# Patient Record
Sex: Male | Born: 1935 | Race: White | Hispanic: No | Marital: Married | Smoking: Former smoker
Health system: Southern US, Community
[De-identification: ages and names within clinical notes are randomized; demographics above are authoritative.]

## PROBLEM LIST (undated history)

## (undated) DIAGNOSIS — H409 Unspecified glaucoma: Secondary | ICD-10-CM

## (undated) DIAGNOSIS — J449 Chronic obstructive pulmonary disease, unspecified: Secondary | ICD-10-CM

## (undated) DIAGNOSIS — J45909 Unspecified asthma, uncomplicated: Secondary | ICD-10-CM

## (undated) DIAGNOSIS — C799 Secondary malignant neoplasm of unspecified site: Secondary | ICD-10-CM

## (undated) DIAGNOSIS — I10 Essential (primary) hypertension: Secondary | ICD-10-CM

## (undated) DIAGNOSIS — M1990 Unspecified osteoarthritis, unspecified site: Secondary | ICD-10-CM

## (undated) DIAGNOSIS — R109 Unspecified abdominal pain: Secondary | ICD-10-CM

## (undated) DIAGNOSIS — Z87891 Personal history of nicotine dependence: Secondary | ICD-10-CM

## (undated) DIAGNOSIS — I509 Heart failure, unspecified: Secondary | ICD-10-CM

## (undated) DIAGNOSIS — I700 Atherosclerosis of aorta: Secondary | ICD-10-CM

## (undated) DIAGNOSIS — S22079A Unspecified fracture of T9-T10 vertebra, initial encounter for closed fracture: Secondary | ICD-10-CM

## (undated) DIAGNOSIS — I2510 Atherosclerotic heart disease of native coronary artery without angina pectoris: Secondary | ICD-10-CM

## (undated) DIAGNOSIS — Z8709 Personal history of other diseases of the respiratory system: Secondary | ICD-10-CM

## (undated) DIAGNOSIS — J302 Other seasonal allergic rhinitis: Secondary | ICD-10-CM

## (undated) DIAGNOSIS — N200 Calculus of kidney: Secondary | ICD-10-CM

## (undated) HISTORY — DX: Personal history of nicotine dependence: Z87.891

## (undated) HISTORY — DX: Chronic obstructive pulmonary disease, unspecified: J44.9

## (undated) HISTORY — DX: Unspecified abdominal pain: R10.9

## (undated) HISTORY — DX: Unspecified osteoarthritis, unspecified site: M19.90

## (undated) HISTORY — DX: Unspecified fracture of t9-t10 vertebra, initial encounter for closed fracture: S22.079A

## (undated) HISTORY — DX: Unspecified glaucoma: H40.9

## (undated) HISTORY — DX: Calculus of kidney: N20.0

## (undated) HISTORY — DX: Atherosclerotic heart disease of native coronary artery without angina pectoris: I25.10

## (undated) HISTORY — DX: Other seasonal allergic rhinitis: J30.2

## (undated) HISTORY — DX: Personal history of other diseases of the respiratory system: Z87.09

## (undated) HISTORY — DX: Atherosclerosis of aorta: I70.0

## (undated) HISTORY — DX: Secondary malignant neoplasm of unspecified site: C79.9

---

## 2006-08-07 DIAGNOSIS — I2510 Atherosclerotic heart disease of native coronary artery without angina pectoris: Secondary | ICD-10-CM

## 2006-08-07 HISTORY — DX: Atherosclerotic heart disease of native coronary artery without angina pectoris: I25.10

## 2006-08-07 HISTORY — PX: CORONARY ARTERY BYPASS GRAFT: SHX141

## 2009-01-05 HISTORY — PX: CAROTID ENDARTERECTOMY: SUR193

## 2015-07-21 LAB — COMPREHENSIVE METABOLIC PANEL
ALT: 18
AST: 18 U/L
Alkaline Phosphatase: 78 U/L
BILIRUBIN TOTAL: 0.8 mg/dL
CREATININE: 0.97
GLUCOSE: 105
POTASSIUM: 4.3 mmol/L

## 2015-07-21 LAB — HEMOGLOBIN A1C: A1C: 5.5

## 2015-07-21 LAB — LIPID PANEL
Cholesterol: 203
HDL: 38
LDL CALC: 148
Triglycerides: 83

## 2015-07-21 LAB — CBC
HGB: 15.5 g/dL
PLATELET COUNT: 161
WBC: 8.3

## 2015-07-21 LAB — PSA: PSA: 1.8

## 2015-07-21 LAB — TSH: TSH: 5.9

## 2015-10-06 ENCOUNTER — Encounter (INDEPENDENT_AMBULATORY_CARE_PROVIDER_SITE_OTHER): Payer: Self-pay

## 2015-10-06 ENCOUNTER — Ambulatory Visit (INDEPENDENT_AMBULATORY_CARE_PROVIDER_SITE_OTHER): Payer: Medicare PPO | Admitting: Family Medicine

## 2015-10-06 ENCOUNTER — Encounter: Payer: Self-pay | Admitting: Family Medicine

## 2015-10-06 VITALS — BP 116/72 | HR 68 | Temp 98.3°F | Ht 65.0 in | Wt 166.8 lb

## 2015-10-06 DIAGNOSIS — J449 Chronic obstructive pulmonary disease, unspecified: Secondary | ICD-10-CM | POA: Diagnosis not present

## 2015-10-06 DIAGNOSIS — R634 Abnormal weight loss: Secondary | ICD-10-CM

## 2015-10-06 DIAGNOSIS — N200 Calculus of kidney: Secondary | ICD-10-CM

## 2015-10-06 DIAGNOSIS — R1909 Other intra-abdominal and pelvic swelling, mass and lump: Secondary | ICD-10-CM

## 2015-10-06 DIAGNOSIS — I2510 Atherosclerotic heart disease of native coronary artery without angina pectoris: Secondary | ICD-10-CM | POA: Insufficient documentation

## 2015-10-06 DIAGNOSIS — H409 Unspecified glaucoma: Secondary | ICD-10-CM | POA: Insufficient documentation

## 2015-10-06 DIAGNOSIS — R1033 Periumbilical pain: Secondary | ICD-10-CM | POA: Diagnosis not present

## 2015-10-06 DIAGNOSIS — R140 Abdominal distension (gaseous): Secondary | ICD-10-CM

## 2015-10-06 HISTORY — PX: COLONOSCOPY: SHX174

## 2015-10-06 LAB — COMPREHENSIVE METABOLIC PANEL WITH GFR
ALT: 26 U/L (ref 0–53)
AST: 34 U/L (ref 0–37)
Albumin: 3.7 g/dL (ref 3.5–5.2)
Alkaline Phosphatase: 77 U/L (ref 39–117)
BUN: 19 mg/dL (ref 6–23)
CO2: 32 meq/L (ref 19–32)
Calcium: 9.1 mg/dL (ref 8.4–10.5)
Chloride: 102 meq/L (ref 96–112)
Creatinine, Ser: 1 mg/dL (ref 0.40–1.50)
GFR: 76.69 mL/min
Glucose, Bld: 94 mg/dL (ref 70–99)
Potassium: 4 meq/L (ref 3.5–5.1)
Sodium: 139 meq/L (ref 135–145)
Total Bilirubin: 1.1 mg/dL (ref 0.2–1.2)
Total Protein: 6.7 g/dL (ref 6.0–8.3)

## 2015-10-06 LAB — CBC WITH DIFFERENTIAL/PLATELET
BASOS PCT: 0.3 % (ref 0.0–3.0)
Basophils Absolute: 0 10*3/uL (ref 0.0–0.1)
EOS ABS: 0.2 10*3/uL (ref 0.0–0.7)
Eosinophils Relative: 2.1 % (ref 0.0–5.0)
HEMATOCRIT: 40.8 % (ref 39.0–52.0)
Hemoglobin: 13.9 g/dL (ref 13.0–17.0)
Lymphocytes Relative: 23.9 % (ref 12.0–46.0)
Lymphs Abs: 1.7 10*3/uL (ref 0.7–4.0)
MCHC: 34 g/dL (ref 30.0–36.0)
MCV: 99.5 fl (ref 78.0–100.0)
MONO ABS: 0.7 10*3/uL (ref 0.1–1.0)
Monocytes Relative: 9.1 % (ref 3.0–12.0)
Neutro Abs: 4.7 10*3/uL (ref 1.4–7.7)
Neutrophils Relative %: 64.6 % (ref 43.0–77.0)
Platelets: 208 10*3/uL (ref 150.0–400.0)
RBC: 4.1 Mil/uL — ABNORMAL LOW (ref 4.22–5.81)
RDW: 14.5 % (ref 11.5–15.5)
WBC: 7.2 10*3/uL (ref 4.0–10.5)

## 2015-10-06 NOTE — Progress Notes (Addendum)
BP 116/72 mmHg  Pulse 68  Temp(Src) 98.3 F (36.8 C) (Oral)  Ht 5\' 5"  (1.651 m)  Wt 166 lb 12 oz (75.637 kg)  BMI 27.75 kg/m2   CC: new pt to establish care  Subjective:    Patient ID: Randy Wright, male    DOB: [birth year], 81 y.o.   MRN: [id number]  HPI: Randy Wright is a 81 y.o. male presenting on 10/06/2015 for Establish Care   Husband of Adyant Crowson. Prior saw Dr Ronne Binning in [place] requested today.  Over last 1-2 months noticing some lower abdominal discomfort, distension and umbilical swelling. Also noticed new bulge in groin 3 wks ago. Normal bowel movements, passing gas fine. No nausea/vomiting. No GERD sxs, dysphagia or early satiety. Last BM was this morning x2. Endorses 30lb weight loss over last 3 months - he was trying to lose weight by changing diet.   Asthma/COPD - on symbicort 2 puffs bid, uses this PRN. Doesn't have rescue inhaler. Last flare Nov 2016. Tends to get 2 flares per year.   CAD - on toprol xl 50mg  daily. Takes lasix 40mg  PRN. No recent use. Takes potassium 3x/wk. May want referral to new cardiologist to establish care.   Takes 50,000 IU vit D monthly.   Last wellness visit 06/2015. States had normal labwork then. No recent colonoscopy.   Relevant past medical, surgical, family and social history reviewed and updated as indicated. Interim medical history since our last visit reviewed. Allergies and medications reviewed and updated. No current outpatient prescriptions on file prior to visit.   No current facility-administered medications on file prior to visit.    Review of Systems Per HPI unless specifically indicated in ROS section     Objective:    BP 116/72 mmHg  Pulse 68  Temp(Src) 98.3 F (36.8 C) (Oral)  Ht 5\' 5"  (1.651 m)  Wt 166 lb 12 oz (75.637 kg)  BMI 27.75 kg/m2  Wt Readings from Last 3 Encounters:  10/06/15 166 lb 12 oz (75.637 kg)    Physical Exam  Constitutional: He appears well-developed and  well-nourished. No distress.  HENT:  Mouth/Throat: Oropharynx is clear and moist. No oropharyngeal exudate.  Cardiovascular: Normal rate, regular rhythm, normal heart sounds and intact distal pulses.   No murmur heard. Pulmonary/Chest: Effort normal and breath sounds normal. No respiratory distress. He has no wheezes. He has no rales.  Abdominal: Bowel sounds are normal. He exhibits distension. There is no hepatosplenomegaly. There is tenderness (mild) in the periumbilical area. There is no rigidity, no rebound, no guarding, no CVA tenderness and negative Murphy's sign. A hernia is present. Hernia confirmed positive in the ventral area. Hernia confirmed negative in the left inguinal area.  Distended abdomen throughout Tender to palpation periumbilically but no obvious umbilical hernia appreciated  Genitourinary: Penis normal. Right testis shows swelling (mild swelling of R testicle ?congestive). Right testis shows no mass and no tenderness. Right testis is descended. Left testis shows no mass, no swelling and no tenderness. Left testis is descended.  ~4cm mass R groin anticipate large firm lymphadenopathy, less likely inguinal hernia  Musculoskeletal: He exhibits edema (1+ R, 2+ L).  Chronic edema L>R (L was site of vein harvesting)  Lymphadenopathy:       Left: No inguinal adenopathy present.  Skin: Skin is warm and dry.  Slightly indurated erythematous umbilicus with scab Small ~1cm skin nodule R lower abd, nontender  Nursing note and vitals reviewed.  No results found for this or  any previous visit.    Assessment & Plan:   Problem List Items Addressed This Visit    Nephrolithiasis   Glaucoma    Continue drops      Relevant Medications   brimonidine (ALPHAGAN) 0.2 % ophthalmic solution   timolol (BETIMOL) 0.5 % ophthalmic solution   travoprost, benzalkonium, (TRAVATAN) 0.004 % ophthalmic solution   COPD (chronic obstructive pulmonary disease) ([place])    Discussed symbicort use and  quick need to seek care for COPD exacerbation.      Relevant Medications   budesonide-formoterol (SYMBICORT) 160-4.5 MCG/ACT inhaler   CAD (coronary artery disease)    Await records, then likely refer to cards to establish care      Relevant Medications   furosemide (LASIX) 40 MG tablet   metoprolol succinate (TOPROL-XL) 50 MG 24 hr tablet   Abdominal pain, periumbilical - Primary    Concerning history of abd discomfort, distension and periumbilical swelling with endorsed weight loss along with concerning findings on exam today.  Start with labwork today and CT abd/pelvis with contrast as long as kidneys will allow for this - r/o inguinal hernia, incarcerated umbilical hernia, eval for more ominous intra abdominal pathology.  Will call patient with results.       Relevant Orders   CT Abdomen Pelvis W Contrast   Comprehensive metabolic panel   CBC with Differential/Platelet    Other Visit Diagnoses    Loss of weight        Relevant Orders    CT Abdomen Pelvis W Contrast    Comprehensive metabolic panel    CBC with Differential/Platelet    Right groin mass        Relevant Orders    CT Abdomen Pelvis W Contrast    Abdominal distension        Relevant Orders    CT Abdomen Pelvis W Contrast        Follow up plan: Return if symptoms worsen or fail to improve.

## 2015-10-06 NOTE — Progress Notes (Signed)
Pre visit review using our clinic review tool, if applicable. No additional management support is needed unless otherwise documented below in the visit note. 

## 2015-10-06 NOTE — Patient Instructions (Addendum)
Blood work today, pass by [facility] office to schedule CT scan for this week. We will call you with results.  Continue medicines as up to now.  We will request records from Dr Ronne Binning.

## 2015-10-06 NOTE — Assessment & Plan Note (Addendum)
Concerning history of abd discomfort, distension and periumbilical swelling with endorsed weight loss along with concerning findings on exam today.  Start with labwork today and CT abd/pelvis with contrast as long as kidneys will allow for this - r/o inguinal hernia, incarcerated umbilical hernia, eval for more ominous intra abdominal pathology.  Will call patient with results.

## 2015-10-06 NOTE — Assessment & Plan Note (Signed)
Continue drops  

## 2015-10-06 NOTE — Assessment & Plan Note (Signed)
Await records, then likely refer to cards to establish care

## 2015-10-06 NOTE — Assessment & Plan Note (Signed)
Discussed symbicort use and quick need to seek care for COPD exacerbation.

## 2015-10-07 ENCOUNTER — Ambulatory Visit
Admission: RE | Admit: 2015-10-07 | Discharge: 2015-10-07 | Disposition: A | Discharge status: Home / Self Care | Payer: Medicare PPO | Source: Ambulatory Visit | Attending: Family Medicine | Admitting: Family Medicine

## 2015-10-07 DIAGNOSIS — I2510 Atherosclerotic heart disease of native coronary artery without angina pectoris: Secondary | ICD-10-CM | POA: Insufficient documentation

## 2015-10-07 DIAGNOSIS — C786 Secondary malignant neoplasm of retroperitoneum and peritoneum: Secondary | ICD-10-CM | POA: Diagnosis not present

## 2015-10-07 DIAGNOSIS — K6389 Other specified diseases of intestine: Secondary | ICD-10-CM | POA: Diagnosis not present

## 2015-10-07 DIAGNOSIS — R918 Other nonspecific abnormal finding of lung field: Secondary | ICD-10-CM | POA: Diagnosis not present

## 2015-10-07 DIAGNOSIS — R1909 Other intra-abdominal and pelvic swelling, mass and lump: Secondary | ICD-10-CM | POA: Diagnosis present

## 2015-10-07 DIAGNOSIS — R1033 Periumbilical pain: Secondary | ICD-10-CM | POA: Insufficient documentation

## 2015-10-07 DIAGNOSIS — R140 Abdominal distension (gaseous): Secondary | ICD-10-CM | POA: Insufficient documentation

## 2015-10-07 DIAGNOSIS — R634 Abnormal weight loss: Secondary | ICD-10-CM | POA: Insufficient documentation

## 2015-10-07 DIAGNOSIS — I700 Atherosclerosis of aorta: Secondary | ICD-10-CM | POA: Insufficient documentation

## 2015-10-07 HISTORY — DX: Heart failure, unspecified: I50.9

## 2015-10-07 HISTORY — DX: Essential (primary) hypertension: I10

## 2015-10-07 HISTORY — DX: Unspecified asthma, uncomplicated: J45.909

## 2015-10-07 MED ORDER — IOHEXOL 300 MG/ML  SOLN
100.0000 mL | Freq: Once | INTRAMUSCULAR | Status: AC | PRN
  Administered 2015-10-07: 100 mL via INTRAVENOUS

## 2015-10-08 ENCOUNTER — Telehealth: Payer: Self-pay | Admitting: Family Medicine

## 2015-10-08 ENCOUNTER — Telehealth: Payer: Self-pay | Admitting: *Deleted

## 2015-10-08 NOTE — Telephone Encounter (Signed)
Linus Orn at [facility] called.  The radiologist wanted to make sure that Dr.Gutierrez' was aware the results of patient's CT is in [place].

## 2015-10-08 NOTE — Telephone Encounter (Signed)
Spoke with Dr Hilarie Fredrickson re: further eval/management plan.. He has available colonoscopy slot Wednesday 8am and they will contact patient later today.  Spoke with patient and wife. Discussed likely metastatic colon or appendix cancer spread throughout abdomen. Advised GI office will be in touch later today.  Discussed f/u plan.

## 2015-10-08 NOTE — Telephone Encounter (Signed)
I have spoken to patient's wife to advise of colonoscopy scheduled for Wednesday and previsit scheduled for Monday. She verbalizes understanding.

## 2015-10-10 ENCOUNTER — Encounter: Payer: Self-pay | Admitting: Family Medicine

## 2015-10-10 DIAGNOSIS — C799 Secondary malignant neoplasm of unspecified site: Secondary | ICD-10-CM

## 2015-10-10 DIAGNOSIS — I700 Atherosclerosis of aorta: Secondary | ICD-10-CM | POA: Insufficient documentation

## 2015-10-10 HISTORY — DX: Secondary malignant neoplasm of unspecified site: C79.9

## 2015-10-11 ENCOUNTER — Ambulatory Visit (AMBULATORY_SURGERY_CENTER): Payer: Self-pay

## 2015-10-11 VITALS — Ht 65.0 in | Wt 166.6 lb

## 2015-10-11 DIAGNOSIS — R935 Abnormal findings on diagnostic imaging of other abdominal regions, including retroperitoneum: Secondary | ICD-10-CM

## 2015-10-11 DIAGNOSIS — R1900 Intra-abdominal and pelvic swelling, mass and lump, unspecified site: Secondary | ICD-10-CM

## 2015-10-11 MED ORDER — NA SULFATE-K SULFATE-MG SULF 17.5-3.13-1.6 GM/177ML PO SOLN: ORAL | Status: DC

## 2015-10-11 NOTE — Progress Notes (Signed)
Per pt, no allergies to soy or egg products.Pt not taking any weight loss meds or using  O2 at home. 

## 2015-10-12 ENCOUNTER — Ambulatory Visit (INDEPENDENT_AMBULATORY_CARE_PROVIDER_SITE_OTHER): Payer: Medicare PPO | Admitting: Family Medicine

## 2015-10-12 DIAGNOSIS — C799 Secondary malignant neoplasm of unspecified site: Secondary | ICD-10-CM

## 2015-10-12 DIAGNOSIS — C801 Malignant (primary) neoplasm, unspecified: Secondary | ICD-10-CM

## 2015-10-12 NOTE — Progress Notes (Signed)
Pre visit review using our clinic review tool, if applicable. No additional management support is needed unless otherwise documented below in the visit note. 

## 2015-10-12 NOTE — Progress Notes (Signed)
Family here, pt not present. Pt case discussed.

## 2015-10-13 ENCOUNTER — Ambulatory Visit (AMBULATORY_SURGERY_CENTER): Payer: Medicare PPO | Admitting: Internal Medicine

## 2015-10-13 ENCOUNTER — Encounter: Payer: Self-pay | Admitting: *Deleted

## 2015-10-13 ENCOUNTER — Encounter: Payer: Self-pay | Admitting: Internal Medicine

## 2015-10-13 VITALS — BP 133/72 | HR 79 | Temp 97.1°F | Resp 19 | Ht 65.0 in | Wt 166.0 lb

## 2015-10-13 DIAGNOSIS — R933 Abnormal findings on diagnostic imaging of other parts of digestive tract: Secondary | ICD-10-CM

## 2015-10-13 DIAGNOSIS — R634 Abnormal weight loss: Secondary | ICD-10-CM | POA: Diagnosis not present

## 2015-10-13 DIAGNOSIS — C801 Malignant (primary) neoplasm, unspecified: Secondary | ICD-10-CM

## 2015-10-13 DIAGNOSIS — C189 Malignant neoplasm of colon, unspecified: Secondary | ICD-10-CM | POA: Insufficient documentation

## 2015-10-13 MED ORDER — SODIUM CHLORIDE 0.9 % IV SOLN: 500.0000 mL | INTRAVENOUS | Status: DC

## 2015-10-13 NOTE — Progress Notes (Signed)
Path sent rush per D.O. 

## 2015-10-13 NOTE — Progress Notes (Signed)
Report to PACU, RN, vss, BBS= Clear.  

## 2015-10-13 NOTE — Op Note (Signed)
Patient Name: Randy Wright Procedure Date: 10/13/2015 8:25 AM MRN: [id number] Endoscopist: Jerene Bears , MD Age: 81 Referring MD:  Date of Birth: Mar 07, 1935 Gender: Male Procedure:            Colonoscopy Indications:          Abnormal CT of the GI tract, Weight loss Medicines:            Monitored Anesthesia Care Procedure:            Pre-Anesthesia Assessment:                       - Prior to the procedure, a History and Physical was                        performed, and patient medications and allergies were                        reviewed. The patient's tolerance of previous                        anesthesia was also reviewed. The risks and benefits of                        the procedure and the sedation options and risks were                        discussed with the patient. All questions were                        answered, and informed consent was obtained. Prior                        Anticoagulants: The patient has taken no previous                        anticoagulant or antiplatelet agents. ASA Grade                        Assessment: III - A patient with severe systemic                        disease. After reviewing the risks and benefits, the                        patient was deemed in satisfactory condition to undergo                        the procedure.                       After obtaining informed consent, the colonoscope was                        passed under direct vision. Throughout the procedure,                        the patient's blood pressure, pulse, and oxygen                        saturations were monitored continuously. The Model  [id number]) scope was introduced through the                        anus and advanced to the the cecum, identified by                        appendiceal orifice and ileocecal valve. The quality of                        the bowel preparation was good. The ileocecal valve,      appendiceal orifice, and rectum were photographed. Scope In: 8:57:02 AM Scope Out: 9:08:49 AM Scope Withdrawal Time: 0 hours 9 minutes 38 seconds  Total Procedure Duration: 0 hours 11 minutes 47 seconds  Findings:      A frond-like/villous and fungating non-obstructing large mass was found       in the cecum. No bleeding was present. Multiple biopsies were obtained       with cold forceps for histology.      A few sessile polyps were found in the recto-sigmoid colon, transverse       colon and ascending colon. The polyps were 3 to 7 mm in size.       Polypectomy was not attempted due to the identification of cancer.      Multiple diverticula were found in the left colon.      Non-bleeding internal hemorrhoids were found during retroflexion. The       hemorrhoids were small. Complications:        No immediate complications. Estimated Blood Loss: Estimated blood loss was minimal. Impression:           - Likely malignant tumor in the cecum.                       - A few 3 to 7 mm polyps at the recto-sigmoid colon, in                        the transverse colon and in the ascending colon.                        Resection not attempted due to the presence of cecal                        malignancy.                       - Diverticulosis in the left colon.                       - Non-bleeding internal hemorrhoids. Recommendation:       - Patient has a contact number available for                        emergencies. The signs and symptoms of potential                        delayed complications were discussed with the patient.                        Return to normal activities tomorrow. Written discharge  instructions were provided to the patient.                       - Resume previous diet.                       - Continue present medications.                       - Await pathology results.                       - Refer to an oncologist. Procedure Code(s):     --- Professional ---                       [phone], Colonoscopy, flexible; with biopsy, single or                        multiple CPT copyright 2016 American Medical Association. All rights reserved. Lajuan Lines. Hilarie Fredrickson, MD Jerene Bears, MD 10/13/2015 9:27:18 AM This report has been signed electronically. Number of Addenda: 0

## 2015-10-13 NOTE — Patient Instructions (Signed)
YOU HAD AN ENDOSCOPIC PROCEDURE TODAY AT THE [facility]:   Refer to the procedure report that was given to you for any specific questions about what was found during the examination.  If the procedure report does not answer your questions, please call your gastroenterologist to clarify.  If you requested that your care partner not be given the details of your procedure findings, then the procedure report has been included in a sealed envelope for you to review at your convenience later.  YOU SHOULD EXPECT: Some feelings of bloating in the abdomen. Passage of more gas than usual.  Walking can help get rid of the air that was put into your GI tract during the procedure and reduce the bloating. If you had a lower endoscopy (such as a colonoscopy or flexible sigmoidoscopy) you may notice spotting of blood in your stool or on the toilet paper. If you underwent a bowel prep for your procedure, you may not have a normal bowel movement for a few days.  Please Note:  You might notice some irritation and congestion in your nose or some drainage.  This is from the oxygen used during your procedure.  There is no need for concern and it should clear up in a day or so.  SYMPTOMS TO REPORT IMMEDIATELY:   Following lower endoscopy (colonoscopy or flexible sigmoidoscopy):  Excessive amounts of blood in the stool  Significant tenderness or worsening of abdominal pains  Swelling of the abdomen that is new, acute  Fever of 100F or higher   For urgent or emergent issues, a gastroenterologist can be reached at any hour by calling [phone].   DIET: Your first meal following the procedure should be a small meal and then it is ok to progress to your normal diet. Heavy or fried foods are harder to digest and may make you feel nauseous or bloated.  Likewise, meals heavy in dairy and vegetables can increase bloating.  Drink plenty of fluids but you should avoid alcoholic beverages for 24  hours.  ACTIVITY:  You should plan to take it easy for the rest of today and you should NOT DRIVE or use heavy machinery until tomorrow (because of the sedation medicines used during the test).    FOLLOW UP: Our staff will call the number listed on your records the next business day following your procedure to check on you and address any questions or concerns that you may have regarding the information given to you following your procedure. If we do not reach you, we will leave a message.  However, if you are feeling well and you are not experiencing any problems, there is no need to return our call.  We will assume that you have returned to your regular daily activities without incident.  If any biopsies were taken you will be contacted by phone or by letter within the next 1-3 weeks.  Please call us at [phone] if you have not heard about the biopsies in 3 weeks.    SIGNATURES/CONFIDENTIALITY: You and/or your care partner have signed paperwork which will be entered into your electronic medical record.  These signatures attest to the fact that that the information above on your After Visit Summary has been reviewed and is understood.  Full responsibility of the confidentiality of this discharge information lies with you and/or your care-partner. 

## 2015-10-13 NOTE — Progress Notes (Signed)
Called to room to assist during endoscopic procedure.  Patient ID and intended procedure confirmed with present staff. Received instructions for my participation in the procedure from the performing physician.  

## 2015-10-14 ENCOUNTER — Other Ambulatory Visit: Payer: Self-pay

## 2015-10-14 ENCOUNTER — Telehealth: Payer: Self-pay | Admitting: *Deleted

## 2015-10-14 ENCOUNTER — Telehealth: Payer: Self-pay | Admitting: Oncology

## 2015-10-14 DIAGNOSIS — C189 Malignant neoplasm of colon, unspecified: Secondary | ICD-10-CM

## 2015-10-14 NOTE — Telephone Encounter (Signed)
  Follow up Call-  Call back number 10/13/2015  Post procedure Call Back phone  # [phone] or [phone] cell  Permission to leave phone message Yes     Patient questions:  Do you have a fever, pain , or abdominal swelling? No. Pain Score  0 *  Have you tolerated food without any problems? Yes.    Have you been able to return to your normal activities? Yes.    Do you have any questions about your discharge instructions: Diet   No. Medications  No. Follow up visit  No.  Do you have questions or concerns about your Care? No.  Actions: * If pain score is 4 or above: No action needed, pain <4.

## 2015-10-14 NOTE — Telephone Encounter (Signed)
Patient scheduled per Manuela Schwartz 03/23 @ 2 w/Dr. Benay Spice

## 2015-10-27 ENCOUNTER — Encounter: Payer: Self-pay | Admitting: *Deleted

## 2015-10-28 ENCOUNTER — Ambulatory Visit (HOSPITAL_BASED_OUTPATIENT_CLINIC_OR_DEPARTMENT_OTHER): Payer: Medicare PPO | Admitting: Oncology

## 2015-10-28 ENCOUNTER — Encounter: Payer: Self-pay | Admitting: *Deleted

## 2015-10-28 ENCOUNTER — Encounter: Payer: Self-pay | Admitting: Oncology

## 2015-10-28 ENCOUNTER — Telehealth: Payer: Self-pay | Admitting: Oncology

## 2015-10-28 VITALS — BP 113/68 | HR 69 | Temp 98.1°F | Resp 18 | Ht 65.0 in | Wt 159.7 lb

## 2015-10-28 DIAGNOSIS — J449 Chronic obstructive pulmonary disease, unspecified: Secondary | ICD-10-CM | POA: Diagnosis not present

## 2015-10-28 DIAGNOSIS — C189 Malignant neoplasm of colon, unspecified: Secondary | ICD-10-CM

## 2015-10-28 DIAGNOSIS — C180 Malignant neoplasm of cecum: Secondary | ICD-10-CM | POA: Diagnosis not present

## 2015-10-28 DIAGNOSIS — C787 Secondary malignant neoplasm of liver and intrahepatic bile duct: Principal | ICD-10-CM

## 2015-10-28 DIAGNOSIS — C182 Malignant neoplasm of ascending colon: Secondary | ICD-10-CM

## 2015-10-28 NOTE — Progress Notes (Signed)
[facility] Patient Consult   Referring MD: Harvard Quattrochi 81 y.o.  01/24/1935    Reason for Referral: Colon cancer  HPI: Mr. Silvan recently relocated to [place]. He saw Dr. Danise Mina to establish with a primary physician on 10/06/2015. He was noted to have a distended abdomen with a mass in the right groin. He was referred for a CT of the abdomen and pelvis on 10/07/2015. Nodules were noted at the lung bases. Numerous liver lesions were identified. An irregular mass extends from the cecum with surrounding mesenteric lymph nodes. The posterior wall of the bladder appeared irregular. Significant adenopathy was noted in the retroperitoneum. There is a large gastrohepatic node. There is thickening of the omentum. A right inguinal lymph node measures 5.3 x 3.2 cm. A left iliac node measured 3.2 cm.  He was referred to Dr.Pyrtle And was taken to a colonoscopy 10/13/2015. A nonobstructing mass was found in the cecum. Multiple biopsies were obtained. A sessile polyps were found in the rectosigmoid colon, transverse colon, and ascending colon. The polyps were not removed. Nonbleeding internal hemorrhoids were noted during retroflexion. The pathology [id number]) confirmed adenocarcinoma arising in a tubulovillous adenoma with high-grade dysplasia.    Past Medical History  Diagnosis Date  . History of asthma childhood  . Arthritis   . COPD (chronic obstructive pulmonary disease) ([place])   . Glaucoma     Dr Cephus Shelling at [facility]  . Seasonal allergies   . Nephrolithiasis     intermittent  . CAD (coronary artery disease) 2008    3v CABG   . Ex-smoker   .    Marland Kitchen CHF (congestive heart failure) ([place])   . Hypertension   . Colon cancer-cecum  March 2017       . T10 vertebral fracture ([place]) remote    by CT  . Abdominal aortic atherosclerosis ([place])     by CT  .          Past Surgical History  Procedure Laterality Date  . Coronary artery bypass graft  2008      3v bypass at [facility]  . Carotid endarterectomy  01/2009  . Colonoscopy  10/2015    large mass sigmoid colon, polyps and diverticulosis ([place])    Medications: Reviewed  Allergies:  Allergies  Allergen Reactions  . Penicillins Other (See Comments)    He states his eyes get set and he can't focus.   . Sulfa Antibiotics Rash    Family history: His mother died of breast cancer in her 74s. No other family history of cancer. He has 2 children.  Social History:   He lives with his wife in [place]. He is retired from a [employer] job. He quit smoking cigarettes in 1988. No alcohol use. No transfusion history. No risk factor for HIV or hepatitis.     ROS:   Positives include: 20 pound weight loss-he relates this to a change in his diet, frequent upper respiratory infections, wheezing in cold weather, "gas "pain in the left abdomen, abdominal distention  A complete ROS was otherwise negative.  Physical Exam:  Blood pressure 113/68, pulse 69, temperature 98.1 F (36.7 C), temperature source Oral, resp. rate 18, height 5\' 5"  (1.651 m), weight 159 lb 11.2 oz (72.439 kg), SpO2 99 %.  HEENT: Multiple missing teeth, oropharynx without visible mass, neck without mass Lungs: Clear bilaterally Cardiac: Regular rate and rhythm Abdomen: Mildly distended, no hepatomegaly, no apparent ascites, nontender GU: Testes without  mass, uncircumcised  Vascular: Trace edema at the left greater than right lower leg Lymph nodes: No cervical, supraclavicular, axillary, or left inguinal nodes. Firm 4-5 cm node in the right inguinal region Neurologic: Alert and oriented, the motor exam appears intact in the upper and lower extremities Skin: No rash, 1 cm erythematous nodule at the right lower abdominal wall, superior to this there is a 3-4 mm cutaneous nodule, crusted nodular lesion at the umbilicus measuring 1.5 cm Musculoskeletal: No spine tenderness   LAB:  CBC  Lab Results  Component  Value Date   WBC 7.2 10/06/2015   HGB 13.9 10/06/2015   HCT 40.8 10/06/2015   MCV 99.5 10/06/2015   PLT 208.0 10/06/2015   NEUTROABS 4.7 10/06/2015     CMP      Component Value Date/Time   NA 139 10/06/2015 1254   K 4.0 10/06/2015 1254   K 4.3 07/21/2015   CL 102 10/06/2015 1254   CO2 32 10/06/2015 1254   GLUCOSE 94 10/06/2015 1254   BUN 19 10/06/2015 1254   CREATININE 1.00 10/06/2015 1254   CREATININE 0.97 07/21/2015   CALCIUM 9.1 10/06/2015 1254   PROT 6.7 10/06/2015 1254   ALBUMIN 3.7 10/06/2015 1254   AST 34 10/06/2015 1254   AST 18 07/21/2015   ALT 26 10/06/2015 1254   ALT 18 07/21/2015   ALKPHOS 77 10/06/2015 1254   ALKPHOS 78 07/21/2015   BILITOT 1.1 10/06/2015 1254   BILITOT 0.8 07/21/2015      Imaging: As per history of present illness, CT abdomen/pelvis 10/07/2015 reviewed with Mr. Leamon Arnt and his wife  Assessment/Plan:   1. Adenocarcinoma the cecum, status post a colonoscopy 10/13/2015 confirming a nonobstructing cecal mass  CT of the abdomen and pelvis 10/07/2015 revealed a cecal mass, liver metastases, lung nodules, mesenteric nodules, abdominal/pelvic lymphadenopathy, and omental caking  Physical exam 10/28/2015 consistent with cutaneous metastases at the abdominal wall and umbilicus   2. COPD  3.   Coronary artery disease-status post coronary artery bypass surgery  4.    Glaucoma   Disposition:   Mr. Degarmo has been diagnosed with colon cancer. The CT scan 10/07/2015 and physical exam findings are consistent with a diagnosis of stage IV colon cancer. I discussed the prognosis and treatment options with Mr. Kulzer and his wife. We reviewed the CT images. No therapy will be curative. There is no indication for surgery at present.  I recommend systemic therapy with FOLFOX and Avastin. I reviewed the potential toxicities associated with the FOLFOX regimen including the chance for nausea/vomiting, mucositis, diarrhea, and hematologic toxicity. We  discussed the sun sensitivity, rash, hyperpigmentation, and hand/foot syndrome associated with 5 fluorouracil. We reviewed the allergic reaction and various types of neuropathy seen with oxaliplatin. We discussed the hypertension, bleeding, delayed wound healing, thromboembolic disease, bowel perforation, nephrotoxicity, and CNS toxicity associated with bevacizumab.  Mr. Illes is not sure whether he will agree to a trial of systemic chemotherapy. He is considering chemotherapy versus comfort care. He would like to talk this over with his wife and other family members and return for an office visit next week.  I explained that his expected survival would be a few months in the absence of systemic therapy.  He will return for an office visit and further discussion on 11/05/2015. He will contact us in the interim as needed.  Approximately 50 minutes were spent with patient today. The majority of the time was used for counseling and correlation of care.  [place],  Dominica Severin, MD  10/28/2015, 2:29 PM

## 2015-10-28 NOTE — Telephone Encounter (Signed)
Gave and printed appt sched and avs for pt for March. °

## 2015-10-28 NOTE — Progress Notes (Signed)
Oncology Nurse Navigator Documentation  Oncology Nurse Navigator Flowsheets 10/28/2015  Navigator Location [facility]  Navigator Encounter Type Initial MedOnc  Abnormal Finding Date 10/07/2015  Confirmed Diagnosis Date 10/13/2015  Patient Visit Type MedOnc;Initial  Treatment Phase Pre-Tx/Tx Discussion  Barriers/Navigation Needs Education  Education Understanding Cancer/ Treatment Options;Coping with Diagnosis/ Prognosis;Pain/ Symptom Management;Newly Diagnosed Cancer Education  Interventions Referrals;Education Method  Referrals Nutrition/dietician  Education Method Verbal;Written;Teach-back  Support Groups/Services GI Support Group;Other--LIving w/Cancer Group and Soil scientist  Acuity Level 2  Time Spent with Patient 64  Met with patient and wife during new patient visit. Explained the role of the GI Nurse Navigator and provided New Patient Packet with information on: 1. Colon cancer w/info on CEA level and general information on chemotherapy side effects 2. Support groups 3. Advanced Directives 4. Fall Safety Plan Answered questions, reviewed current treatment plan using TEACH back and provided emotional support. Provided copy of current treatment plan. Will refer to dietician for continued weight loss and to talk about low residue diet if he becomes constipated or obstructed from tumor. He is undecided about treatment-will discuss w/family and return next week.  Merceda Elks, RN, BSN GI Oncology [place]

## 2015-10-28 NOTE — Patient Instructions (Signed)
Care Plan Summary- 10/28/2015 Name: Randy Wright     DOB: 1934/09/08   Your Medical Team: Medical Oncologist:  Dr. Ma Rings Radiation Oncologist:    Surgeon:   Type of Cancer: Adenocarcinoma of cecum (colon)  Stage/Grade: Stage IV *Exact staging of your cancer is based on size of the tumor, depth of invasion, involvement of lymph nodes or not, and whether or not the cancer has spread beyond the primary site.   Recommendations: Based on information available as of today's consult. Recommendations may change depending on the results of further tests or exams. 1) Chemotherapy with FOLFOX (Oxaliplatin, Leucovorin, 5 Fluoroucil) every 2 weeks-+ Avastin that can have 40-50% chance of shrinking tumor in half 2) 3)  Next Steps: 1) Return to office next week to discuss decision and attend chemo teaching class  2) No treatment: prognosis is months compared to years 3)  Questions? Merceda Elks, RN, BSN at [phone]. Manuela Schwartz is your Oncology Nurse Navigator and is available to assist you while you're receiving your medical care at [facility].

## 2015-10-29 NOTE — Telephone Encounter (Signed)
Pt wife did not want to sch nutrition appt at this time. She will call back once she f/u with their other provider

## 2015-11-02 ENCOUNTER — Ambulatory Visit (INDEPENDENT_AMBULATORY_CARE_PROVIDER_SITE_OTHER): Payer: Medicare PPO | Admitting: Family Medicine

## 2015-11-02 ENCOUNTER — Encounter: Payer: Self-pay | Admitting: Family Medicine

## 2015-11-02 VITALS — BP 110/70 | HR 88 | Temp 98.0°F | Wt 159.0 lb

## 2015-11-02 DIAGNOSIS — C189 Malignant neoplasm of colon, unspecified: Secondary | ICD-10-CM

## 2015-11-02 DIAGNOSIS — C787 Secondary malignant neoplasm of liver and intrahepatic bile duct: Secondary | ICD-10-CM

## 2015-11-02 NOTE — Progress Notes (Signed)
Pre visit review using our clinic review tool, if applicable. No additional management support is needed unless otherwise documented below in the visit note. 

## 2015-11-02 NOTE — Patient Instructions (Addendum)
Trial glucosamine supplement for joints.  Change motrin to tylenol 500mg  twice daily.  Review advanced directive. After you meet with attorney let me know for home hospice referral to establish.  Return in 1 month for follow up visit.

## 2015-11-02 NOTE — Progress Notes (Signed)
BP 110/70 mmHg  Pulse 88  Temp(Src) 98 F (36.7 C) (Oral)  Wt 159 lb (72.122 kg)   CC: discuss metastatic colon cancer treatment options Subjective:    Patient ID: Randy Wright, male    DOB: [birth year], 81 y.o.   MRN: [id number]  HPI: Randy Wright is a 81 y.o. male presenting on 11/02/2015 for Follow-up   See prior notes for details. Colonoscopy ([facility]) confirmed primary cecal adenocarcinoma, imaging consistent stage IV colon cancer with multiple mets to liver, lungs, omentum, lymphadenopathy and cutaneous mets. He saw Dr Benay Spice to discuss options. No curative option, consider palliative chemotherapy. Cited a few months life expectancy if no treatment pursued. Presents today to further discuss. Presents with wife and son today.   Has decided to go holistic route - stopped red meats and sugars. No animal products. Eating fruits/vegetables. Lots of vitamin supplements. Coconut oil, coconut milk. Soaking in epsom salts and . Endorses he feels well. Noted 7lb weight loss.  Main concern today is persistent arthritis pain. Takes 600mg  motrin in am and occasionally at night time.   Relevant past medical, surgical, family and social history reviewed and updated as indicated. Interim medical history since our last visit reviewed. Allergies and medications reviewed and updated. Current Outpatient Prescriptions on File Prior to Visit  Medication Sig  . Ascorbic Acid Buffered (VITAMIN C EFFERVESCENT PO) Take 3,000 mg by mouth daily.  . brimonidine (ALPHAGAN) 0.2 % ophthalmic solution Place 1 drop into both eyes 3 (three) times daily.  . Cholecalciferol (VITAMIN D3) 50000 units CAPS Take 1 capsule by mouth every 30 (thirty) days.  . Coenzyme Q10 (COQ10) 100 MG CAPS Take 100-200 mg by mouth daily.  . metoprolol succinate (TOPROL-XL) 50 MG 24 hr tablet Take 50 mg by mouth daily. Take with or immediately following a meal.  . potassium gluconate 595 (99 K) MG TABS tablet Take 595 mg  by mouth once a week.   . timolol (BETIMOL) 0.5 % ophthalmic solution Place 1 drop into both eyes 3 (three) times daily.  . travoprost, benzalkonium, (TRAVATAN) 0.004 % ophthalmic solution Place 1 drop into both eyes at bedtime.  . budesonide-formoterol (SYMBICORT) 160-4.5 MCG/ACT inhaler Inhale 2 puffs into the lungs 2 (two) times daily. Reported on 11/02/2015   No current facility-administered medications on file prior to visit.    Review of Systems Per HPI unless specifically indicated in ROS section     Objective:    BP 110/70 mmHg  Pulse 88  Temp(Src) 98 F (36.7 C) (Oral)  Wt 159 lb (72.122 kg)  Wt Readings from Last 3 Encounters:  11/02/15 159 lb (72.122 kg)  10/28/15 159 lb 11.2 oz (72.439 kg)  10/13/15 166 lb (75.297 kg)    Physical Exam  Constitutional: He appears well-developed and well-nourished. No distress.  Psychiatric: He has a normal mood and affect.  Nursing note and vitals reviewed.  Results for orders placed or performed in visit on 10/13/15  CBC  Result Value Ref Range   WBC 8.3    HGB 15.5 g/dL   platelet count 161   Comprehensive metabolic panel  Result Value Ref Range   Glucose 105    Creat 0.97    Potassium 4.3 mmol/L   Total Bilirubin 0.8 mg/dL   Alkaline Phosphatase 78 U/L   AST 18 U/L   ALT 18   Lipid panel  Result Value Ref Range   Cholesterol 203    Triglycerides 83    HDL 38  LDL (calc) 148   Hemoglobin A1c  Result Value Ref Range   A1c 5.5   TSH  Result Value Ref Range   TSH 5.900   PSA  Result Value Ref Range   PSA 1.8       Assessment & Plan:  Over 25 minutes were spent face-to-face with the patient during this encounter and >50% of that time was spent on counseling and coordination of care  Problem List Items Addressed This Visit    Metastatic colon cancer to liver [facility]) - Primary    Reviewed recent diagnosis again and cited few month survival off chemo. Pt has decided to forgo chemotherapy. Has decided for holistic  approach with anti inflammatory diet and essential oils.  Pt likely unrealistically hopeful for improvement with this regimen.  Reviewed mortality of his advanced cancer diagnosis.  Discussed and encouraged soon hospice referral to establish care even if he does not feel he currently needs service - wife wants to touch base with lawyer first about ability for family to maintain autonomy and then will let me know when ready for referral.  RTC 1 month for f/u visit, consider rpt labwork vs imaging at that time      Relevant Medications   acetaminophen (TYLENOL) 500 MG tablet       Follow up plan: Return in about 4 weeks (around 11/30/2015), or as needed, for follow up visit.

## 2015-11-03 ENCOUNTER — Telehealth: Payer: Self-pay | Admitting: Oncology

## 2015-11-03 ENCOUNTER — Encounter: Payer: Self-pay | Admitting: *Deleted

## 2015-11-03 NOTE — Assessment & Plan Note (Addendum)
Reviewed recent diagnosis again and cited few month survival off chemo. Pt has decided to forgo chemotherapy. Has decided for holistic approach with anti inflammatory diet and essential oils.  Pt likely unrealistically hopeful for improvement with this regimen.  Reviewed mortality of his advanced cancer diagnosis.  Discussed and encouraged soon hospice referral to establish care even if he does not feel he currently needs service - wife wants to touch base with lawyer first about ability for family to maintain autonomy and then will let me know when ready for referral.  RTC 1 month for f/u visit, consider rpt labwork vs imaging at that time

## 2015-11-03 NOTE — Telephone Encounter (Signed)
returned call and s.w. pt and cx all appts due to pt has chosen other alternatives for tx

## 2015-11-05 ENCOUNTER — Ambulatory Visit: Payer: Medicare PPO | Admitting: Nurse Practitioner

## 2015-11-05 ENCOUNTER — Other Ambulatory Visit: Payer: Medicare PPO

## 2015-11-08 ENCOUNTER — Telehealth: Payer: Self-pay | Admitting: Family Medicine

## 2015-11-08 ENCOUNTER — Encounter: Payer: Medicare PPO | Admitting: Nutrition

## 2015-11-08 DIAGNOSIS — Z515 Encounter for palliative care: Secondary | ICD-10-CM | POA: Insufficient documentation

## 2015-11-08 DIAGNOSIS — Z7189 Other specified counseling: Secondary | ICD-10-CM | POA: Insufficient documentation

## 2015-11-08 DIAGNOSIS — C189 Malignant neoplasm of colon, unspecified: Secondary | ICD-10-CM

## 2015-11-08 DIAGNOSIS — C787 Secondary malignant neoplasm of liver and intrahepatic bile duct: Principal | ICD-10-CM

## 2015-11-08 NOTE — Telephone Encounter (Signed)
Pt's spouse Jemal Griffeth dropped off Living Will and Legal POA which were labeled and scanned into chart.  Pt wanted it noted that he is a resident of [facility] rather than [place] as it shows on some of his [facility] records.  Wife also left note stating that  :"left leg and foot are cold to touch due to severe swelling.  He has taken 40 mg tablet of Furosemide this morning.  We are prepared to have Hospice come for intake session."  Best number to call spouse is [phone] or cell [phone]

## 2015-11-08 NOTE — Telephone Encounter (Signed)
Noted. Hospice referral placed; will try and expedite.  Spoke with wife. No pain, no fever.  He has been on lasix for years. Added to med list. Lasix from this morning has actually helped with swelling today.  rec continue lasix 40mg  daily for next few days then call us with update.

## 2015-11-08 NOTE — Telephone Encounter (Signed)
Referral faxed to [facility].  Pt wife aware hospice will call her directly to set up day and time for someone to come out to home.

## 2015-11-15 ENCOUNTER — Telehealth: Payer: Self-pay

## 2015-11-15 MED ORDER — TRAMADOL HCL 50 MG PO TABS: 50.0000 mg | ORAL_TABLET | Freq: Three times a day (TID) | ORAL | Status: DC | PRN

## 2015-11-15 NOTE — Telephone Encounter (Signed)
plz phone in and notify Vivien Rota.

## 2015-11-15 NOTE — Telephone Encounter (Signed)
Medication phoned to pharmacy. Left detailed message on voicemail of Vivien Rota.

## 2015-11-15 NOTE — Telephone Encounter (Signed)
Vivien Rota nurse with [facility] left v/m requesting tramadol to [facility] [address] for lower back pain.Vivien Rota request cb.

## 2015-11-23 ENCOUNTER — Encounter: Payer: Self-pay | Admitting: Family Medicine

## 2015-11-23 DIAGNOSIS — Z66 Do not resuscitate: Secondary | ICD-10-CM | POA: Insufficient documentation

## 2015-12-03 ENCOUNTER — Encounter: Payer: Self-pay | Admitting: Family Medicine

## 2015-12-03 ENCOUNTER — Ambulatory Visit (INDEPENDENT_AMBULATORY_CARE_PROVIDER_SITE_OTHER): Payer: Medicare PPO | Admitting: Family Medicine

## 2015-12-03 ENCOUNTER — Ambulatory Visit (INDEPENDENT_AMBULATORY_CARE_PROVIDER_SITE_OTHER)
Admission: RE | Admit: 2015-12-03 | Discharge: 2015-12-03 | Disposition: A | Discharge status: Home / Self Care | Source: Ambulatory Visit | Attending: Family Medicine | Admitting: Family Medicine

## 2015-12-03 VITALS — BP 118/70 | HR 76 | Temp 98.1°F | Wt 165.0 lb

## 2015-12-03 DIAGNOSIS — J449 Chronic obstructive pulmonary disease, unspecified: Secondary | ICD-10-CM

## 2015-12-03 DIAGNOSIS — M25511 Pain in right shoulder: Secondary | ICD-10-CM

## 2015-12-03 DIAGNOSIS — Z66 Do not resuscitate: Secondary | ICD-10-CM

## 2015-12-03 DIAGNOSIS — C189 Malignant neoplasm of colon, unspecified: Secondary | ICD-10-CM

## 2015-12-03 DIAGNOSIS — Z515 Encounter for palliative care: Secondary | ICD-10-CM

## 2015-12-03 DIAGNOSIS — C787 Secondary malignant neoplasm of liver and intrahepatic bile duct: Secondary | ICD-10-CM

## 2015-12-03 DIAGNOSIS — M84519A Pathological fracture in neoplastic disease, unspecified shoulder, initial encounter for fracture: Secondary | ICD-10-CM | POA: Insufficient documentation

## 2015-12-03 NOTE — Progress Notes (Signed)
Pre visit review using our clinic review tool, if applicable. No additional management support is needed unless otherwise documented below in the visit note. 

## 2015-12-03 NOTE — Assessment & Plan Note (Signed)
R clavicle pain without inciting trauma. Concern for metastatic disease to bone causing pain.  Check xray today. Reviewed analgesic dosing - ok to continue tylenol, ibuprofen, 1/2 tramadol scheduled

## 2015-12-03 NOTE — Progress Notes (Signed)
BP 118/70 mmHg  Pulse 76  Temp(Src) 98.1 F (36.7 C) (Oral)  Wt 165 lb (74.844 kg)  SpO2 95%   CC: 1 mo f/u visit  Subjective:    Patient ID: Randy Wright, male    DOB: 05-May-1935, 81 y.o.   MRN: [id number]  HPI: Kamarius Meinecke is a 81 y.o. male presenting on 12/03/2015 for Follow-up   See prior note for details. Metastatic colon cancer to liver, lungs, omentum, LN, skin. Has established with home hospice. noticing some weakness - more trouble getting in and out of tub. Appetite waning. Drinking protein shake every morning. Prn tramadol for back and abdominal pain, not really using.  Marked leg swelling over last 1+ months. No pain, but legs stay cold. Already drinking water, avoiding salt/sodium. Despite lasix 40mg  daily. Extra lasix wipes him out. Has been elevating legs daily.   Woke up 15 wks ago with R shoulder pain. Today feeling some better. Main concern is shoulder pain today. Denies inciting trauma/injury. Trouble sleeping on right side.   Relevant past medical, surgical, family and social history reviewed and updated as indicated. Interim medical history since our last visit reviewed. Allergies and medications reviewed and updated. Current Outpatient Prescriptions on File Prior to Visit  Medication Sig  . acetaminophen (TYLENOL) 500 MG tablet Take 500 mg by mouth 2 (two) times daily.  . Ascorbic Acid Buffered (VITAMIN C EFFERVESCENT PO) Take 3,000 mg by mouth daily.  . brimonidine (ALPHAGAN) 0.2 % ophthalmic solution Place 1 drop into both eyes 3 (three) times daily.  . budesonide-formoterol (SYMBICORT) 160-4.5 MCG/ACT inhaler Inhale 2 puffs into the lungs 2 (two) times daily. Reported on 11/02/2015  . Cholecalciferol (VITAMIN D3) 50000 units CAPS Take 1 capsule by mouth every 30 (thirty) days.  . Coenzyme Q10 (COQ10) 100 MG CAPS Take 100-200 mg by mouth daily.  . furosemide (LASIX) 40 MG tablet Take 1 tablet (40 mg total) by mouth daily.  . metoprolol  succinate (TOPROL-XL) 50 MG 24 hr tablet Take 50 mg by mouth daily. Take with or immediately following a meal.  . potassium gluconate 595 (99 K) MG TABS tablet Take 595 mg by mouth once a week.   . timolol (BETIMOL) 0.5 % ophthalmic solution Place 1 drop into both eyes 3 (three) times daily.  . traMADol (ULTRAM) 50 MG tablet Take 1 tablet (50 mg total) by mouth every 8 (eight) hours as needed for moderate pain.  Marland Kitchen travoprost, benzalkonium, (TRAVATAN) 0.004 % ophthalmic solution Place 1 drop into both eyes at bedtime.   No current facility-administered medications on file prior to visit.    Review of Systems Per HPI unless specifically indicated in ROS section     Objective:    BP 118/70 mmHg  Pulse 76  Temp(Src) 98.1 F (36.7 C) (Oral)  Wt 165 lb (74.844 kg)  SpO2 95%  Wt Readings from Last 3 Encounters:  12/03/15 165 lb (74.844 kg)  11/02/15 159 lb (72.122 kg)  10/28/15 159 lb 11.2 oz (72.439 kg)    Physical Exam  Constitutional: He appears well-developed and well-nourished. No distress.  HENT:  Mouth/Throat: Oropharynx is clear and moist. No oropharyngeal exudate.  Musculoskeletal: He exhibits edema (3+ pitting edema to upper thigh).  Point tender to palpation distal clavicle into [facility] joint Limited ROM R shoulder 2/2 pain   Skin: Skin is warm and dry. No rash noted.  Psychiatric: He has a normal mood and affect.  Nursing note and vitals reviewed.  Results  for orders placed or performed in visit on 10/13/15  CBC  Result Value Ref Range   WBC 8.3    HGB 15.5 g/dL   platelet count 161   Comprehensive metabolic panel  Result Value Ref Range   Glucose 105    Creat 0.97    Potassium 4.3 mmol/L   Total Bilirubin 0.8 mg/dL   Alkaline Phosphatase 78 U/L   AST 18 U/L   ALT 18   Lipid panel  Result Value Ref Range   Cholesterol 203    Triglycerides 83    HDL 38    LDL (calc) 148   Hemoglobin A1c  Result Value Ref Range   A1c 5.5   TSH  Result Value Ref Range    TSH 5.900   PSA  Result Value Ref Range   PSA 1.8       Assessment & Plan:   Problem List Items Addressed This Visit    COPD (chronic obstructive pulmonary disease) ([place])    Aware to avoid hot humid environments to prevent COPD flare.  Avoiding outdoors in high pollen season.      Metastatic colon cancer to liver [facility])    Discussed disease progression. Appreciate hospice care.       Hospice care patient    Appreciate hospice care and recs.      DNR (do not resuscitate)   Clavicle pain - Primary    R clavicle pain without inciting trauma. Concern for metastatic disease to bone causing pain.  Check xray today. Reviewed analgesic dosing - ok to continue tylenol, ibuprofen, 1/2 tramadol scheduled      Relevant Orders   DG Clavicle Right       Follow up plan: Return in about 4 weeks (around 12/31/2015), or as needed, for follow up visit.  Ria Bush, MD

## 2015-12-03 NOTE — Assessment & Plan Note (Signed)
Discussed disease progression. Appreciate hospice care.

## 2015-12-03 NOTE — Patient Instructions (Signed)
Clavicle xray today. Increase protein shake to twice daily. Ok to take tramadol 1/2 tablet more regularly for shoulder pain. Return in 1 month for follow up

## 2015-12-03 NOTE — Assessment & Plan Note (Signed)
Appreciate hospice care and recs.

## 2015-12-03 NOTE — Assessment & Plan Note (Addendum)
Aware to avoid hot humid environments to prevent COPD flare.  Avoiding outdoors in high pollen season.

## 2015-12-06 ENCOUNTER — Telehealth: Payer: Self-pay

## 2015-12-06 MED ORDER — TRAMADOL HCL 50 MG PO TABS: 25.0000 mg | ORAL_TABLET | Freq: Four times a day (QID) | ORAL | Status: DC | PRN

## 2015-12-06 MED ORDER — IBUPROFEN 600 MG PO TABS: 600.0000 mg | ORAL_TABLET | Freq: Four times a day (QID) | ORAL | Status: AC | PRN

## 2015-12-06 NOTE — Telephone Encounter (Signed)
Vivien Rota nurse with [facility] left v/m requesting verbal order for motrin 600 mg  One q 4-6 hours. Pt was seen 12/03/15.

## 2015-12-06 NOTE — Telephone Encounter (Signed)
Rx called in as directed. Vivien Rota notified and asked that pharmacy change to [facility] from this point forward. Changed in chart.

## 2015-12-06 NOTE — Telephone Encounter (Signed)
Sent in motrin Rx. plz phone in tramadol rx - may take 1/2 tab QID PRN.

## 2015-12-06 NOTE — Telephone Encounter (Signed)
Vivien Rota nurse with [facility] left v/m; wanted to verify that pt can take tramadol 1/2 tab more regularly; and pt will need new rx for tramadol. Vivien Rota request cb today about motrin and tramadol. Pt will be out of meds in the AM.

## 2015-12-07 ENCOUNTER — Telehealth: Payer: Self-pay | Admitting: Family Medicine

## 2015-12-07 NOTE — Telephone Encounter (Signed)
Spoke with Toni.

## 2015-12-07 NOTE — Telephone Encounter (Signed)
Please call Vivien Rota back. She has a question about the arm sling.  She thinks patient already has an arm sling.

## 2015-12-08 ENCOUNTER — Telehealth: Payer: Self-pay

## 2015-12-08 NOTE — Telephone Encounter (Signed)
Vivien Rota nurse with [facility] left v/m; pt is having more issues with leg swelling; for 10 years pt taking lasix 40 mg daily. Vivien Rota wanted to know if could increase med or change to a different med due to no reduction of swelling in legs. Vivien Rota request cb.

## 2015-12-08 NOTE — Telephone Encounter (Signed)
May try lasix 40mg  in am and 2nd 40mg  dose at 2pm Is he making good UOP? Lab Results  Component Value Date   CREATININE 1.00 10/06/2015

## 2015-12-08 NOTE — Telephone Encounter (Signed)
Vivien Rota notified. She said he is making good UOP, but the effect of the lasix seems to be delayed. Normally it wouldn't take long to start working after he took it, but yesterday he said it didn't start working until around [employer]. Once it did, UOP was good.

## 2015-12-09 NOTE — Telephone Encounter (Addendum)
Vivien Rota nurse with [facility] left v/m; UOP is very decreased; did 24 hr urine collection without good results. Family concerned. Vivien Rota said prior to this urine collection UOP was decent. Pt has increased lasix to bid. On 12/08/15 pt urine output was 700cc and today urine output is 150cc. pts wife wants to know if pt can go to ED to have fluid drawn off. Pt does not have catheter.Dr Darnell Level has already left the office and Dr Silvio Pate advised for Hospice to do straight catheterization; if more than 300 cc leave catheter in; after emptying bladder if pt has any difficulty breathing or SOB go to ED. Vivien Rota voiced understanding and pt does not have SOB now. FYI to Dr Darnell Level and Dr Silvio Pate.

## 2015-12-09 NOTE — Telephone Encounter (Signed)
Certainly would like to keep this hospice patient out of ER If urinary retention, and that is relieved, hopefully he would improve ER otherwise

## 2015-12-10 NOTE — Telephone Encounter (Signed)
plz call toni for update today. How much urine after catheterization, does it remain or was it removed?

## 2015-12-10 NOTE — Telephone Encounter (Signed)
Spoke with Vivien Rota. She said UOP yesterday was 550cc total with and without cath. It was pulled cath last night. No swelling or dyspnea today. He has urinated 3-4 times today with no problems. He also got a new recliner which allows him to get his legs elevated really well.

## 2015-12-17 ENCOUNTER — Other Ambulatory Visit: Payer: Self-pay

## 2015-12-17 MED ORDER — TRAMADOL HCL 50 MG PO TABS: 25.0000 mg | ORAL_TABLET | Freq: Four times a day (QID) | ORAL | Status: AC | PRN

## 2015-12-17 NOTE — Telephone Encounter (Signed)
Randy Wright with [facility] left v/m requesting refill Tramadol to [place] or can call refill to [facility] [place]. Last refilled # 60 on 12/06/15. Seen 12/03/15.

## 2015-12-17 NOTE — Telephone Encounter (Signed)
phoned in. 

## 2015-12-20 ENCOUNTER — Telehealth: Payer: Self-pay

## 2015-12-20 NOTE — Telephone Encounter (Signed)
Spoke with Randy Wright. She ordered Zofran for him. He has no fever, but he has sinus congestion also lungs congested upper lobes, productive cough with white mucus mixed with brown; sinuses visibly swollen along with bilateral ear pain. She was asking if you thought he needed abx and/or decongestant. She asked that you call her and the family. Thanks!

## 2015-12-20 NOTE — Telephone Encounter (Signed)
Spoke with Randy Wright. Concern for sinus infection + worsening vomiting, nausea.  rec continue zofran 8mg  Q8 prn, start levaquin 500mg  daily 7d course. Update with effect.

## 2015-12-20 NOTE — Telephone Encounter (Signed)
plz call for more details. I will try and touch base later today as well

## 2015-12-20 NOTE — Telephone Encounter (Signed)
Vivien Rota nurse with [facility] left v/m; over weekend pt began to vomit thick white mucus, burning of throat and abdomen swollen; presently using cough lozenges and drinking water. Vivien Rota request cb.

## 2015-12-24 ENCOUNTER — Ambulatory Visit: Payer: Medicare PPO | Admitting: Family Medicine

## 2015-12-27 ENCOUNTER — Telehealth: Payer: Self-pay

## 2015-12-27 NOTE — Telephone Encounter (Signed)
Vivien Rota nurse with [facility] left v/m wanting to know if it is OK to start 4% lidocaine patch for pain in coccyx area. Vivien Rota request cb.

## 2015-12-27 NOTE — Telephone Encounter (Signed)
Ok to do this. Thanks. 

## 2015-12-28 NOTE — Telephone Encounter (Signed)
Message left advising Randy Wright.

## 2015-12-31 ENCOUNTER — Ambulatory Visit: Payer: Medicare PPO | Admitting: Family Medicine

## 2015-12-31 ENCOUNTER — Telehealth: Payer: Self-pay

## 2015-12-31 ENCOUNTER — Telehealth: Payer: Self-pay | Admitting: *Deleted

## 2015-12-31 MED ORDER — MORPHINE SULFATE (CONCENTRATE) 20 MG/ML PO SOLN: 5.0000 mg | ORAL | Status: DC | PRN

## 2015-12-31 MED ORDER — METOPROLOL SUCCINATE ER 25 MG PO TB24: 25.0000 mg | ORAL_TABLET | Freq: Every day | ORAL | Status: AC

## 2015-12-31 MED ORDER — LIDOCAINE-PRILOCAINE 2.5-2.5 % EX CREA: 1.0000 "application " | TOPICAL_CREAM | CUTANEOUS | Status: AC

## 2015-12-31 MED ORDER — LIDOCAINE 1%/NA BICARB 0.1 MEQ INJECTION: 1.0000 mL | INJECTION | INTRAVENOUS | Status: DC | PRN

## 2015-12-31 MED ORDER — MORPHINE SULFATE (CONCENTRATE) 20 MG/ML PO SOLN: 5.0000 mg | ORAL | Status: AC | PRN

## 2015-12-31 NOTE — Telephone Encounter (Signed)
Toni with [facility] request lidocaine injections to numb up the coccyx area; the lidocaine patch is not helping. Vivien Rota said the pt is actively dying and stays in recliner, pt not getting in hospital bed. Vivien Rota request cb ASAP.

## 2015-12-31 NOTE — Telephone Encounter (Signed)
Spoke with Vivien Rota. Main pain is at coccyx, trouble with position changes due to this. D/c lidocaine patch.  May try local subcutaenous lidocaine injections to AA at coccyx - 1% lidocaine up to 1 cc Q4 hours prn pain. If ineffective, may start morphine 20mg /ml 0.25-0.63mL Q4 hours prn pain.  Will send in lidocaine and morphine Rx to pharmacy. Kim plz call in lidocaine and morphine for hospice patient. plz fax this phone note attn hospice.

## 2015-12-31 NOTE — Telephone Encounter (Addendum)
Spoke with wife. Discussed hospice concerns.  Recommended decease toprol XL to 25mg  daily.  plz notify hospice of new toprol XL dose.

## 2015-12-31 NOTE — Telephone Encounter (Signed)
Pharmacy will require written Rx on file. This can be faxed. Printed for signature. Phone note faxed to hospice.

## 2015-12-31 NOTE — Telephone Encounter (Signed)
Mrs Garretson left v/m requesting cb from Dr Danise Mina; Mrs Fitzsimmons said pt is lucid and alert; pt does not want to start on Morphine; pt wants to take the edge off of the pain having at [facility]. Mrs Lienemann is concerned about the over zealousness of hospice wanting to change and push meds.

## 2015-12-31 NOTE — Addendum Note (Signed)
Addended by: Ria Bush on: 12/31/2015 05:33 PM   Modules accepted: Orders, Medications

## 2015-12-31 NOTE — Telephone Encounter (Signed)
agree

## 2015-12-31 NOTE — Telephone Encounter (Signed)
Randy Wright at [facility] called. Wants to retract lidocaine injections due to possible skin breakdown since he is non-ambulatory. She was asking for EMLA cream topically. Dr. Darnell Level advised okay to use Q4-6 hours PRN. Randy Wright notified. EMLA sent to pharmacy.

## 2016-01-07 ENCOUNTER — Encounter: Payer: Self-pay | Admitting: Family Medicine

## 2016-01-07 ENCOUNTER — Telehealth: Payer: Self-pay | Admitting: Family Medicine

## 2016-01-07 NOTE — Telephone Encounter (Signed)
Called and spoke with wife - expressed my condolences.

## 2016-02-05 DEATH — deceased

## 2017-03-20 IMAGING — CT CT ABD-PELV W/ CM
1 of 3 series · 12 of 32 positions shown, 17 images · IV contrast (APPLIED)
Comparison: None.

CLINICAL DATA: Patient c/o Mid abdominal soreness x 1 [DATE] months.
He states he feels bloated. He states he also feels like he has
lower pelvic pressure and has to have a BM and void at same time.
NKI. No hx CA. No hx surg.

EXAM:
CT ABDOMEN AND PELVIS WITH CONTRAST
TECHNIQUE: Multidetector CT imaging of the abdomen and pelvis was performed
using the standard protocol following bolus administration of
intravenous contrast.
CONTRAST:  100mL OMNIPAQUE IOHEXOL 300 MG/ML  SOLN

[Series 2: axial st · axial · 0.77mm/px · z∈[-966,-531]mm · 12 of 99 slices shown, 17 images]
[im 6/99  soft-tissue]
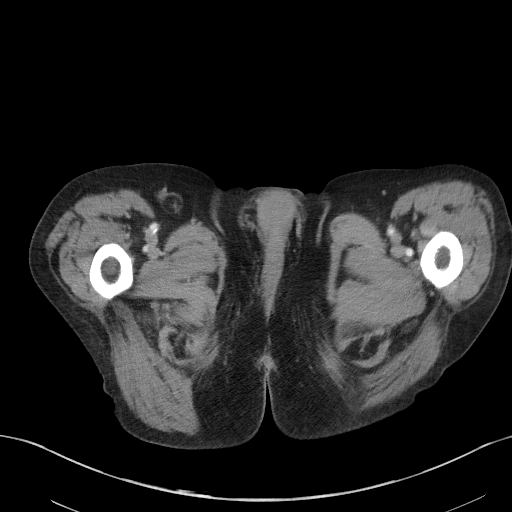
[im 6/99  bone]
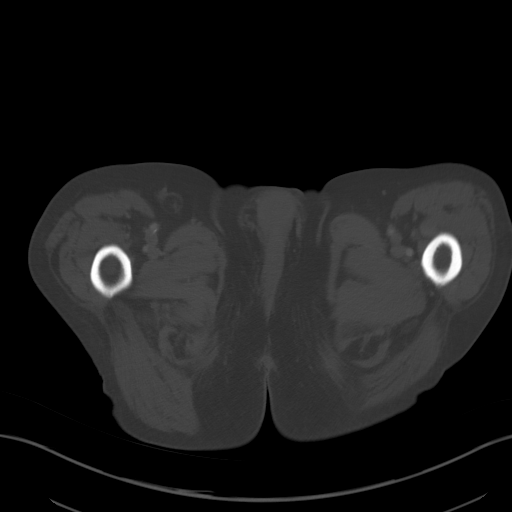
[im 17/99  soft-tissue]
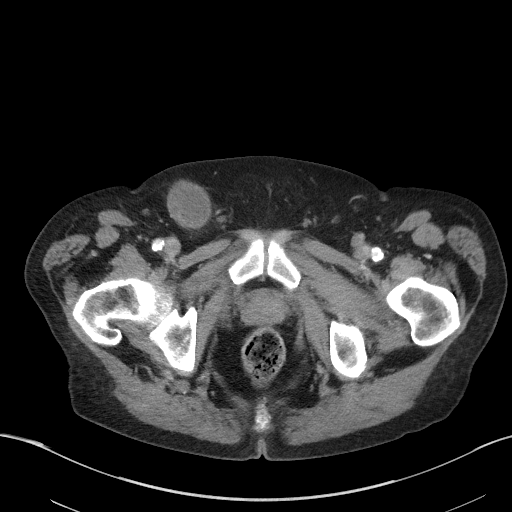
[im 22/99  soft-tissue]
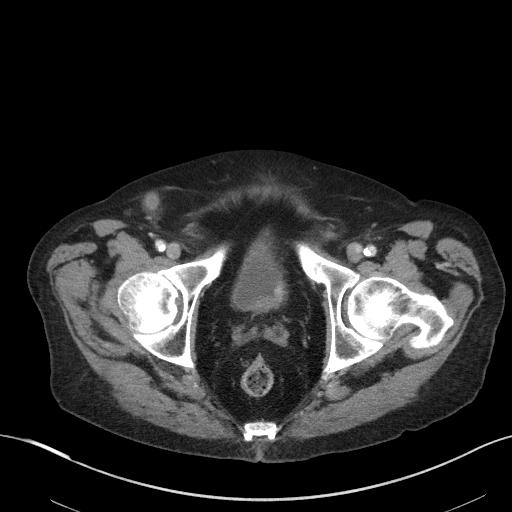
[im 33/99  soft-tissue]
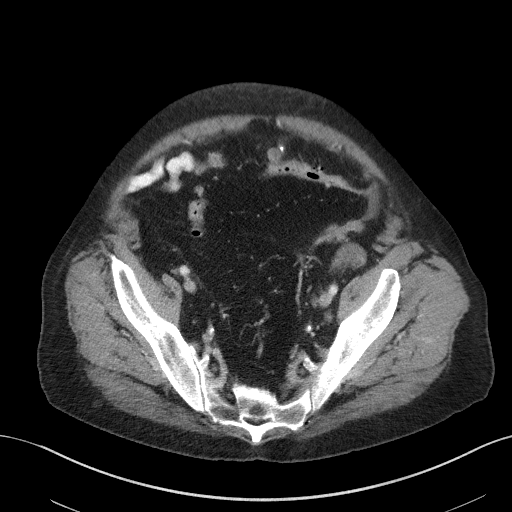
[im 39/99  soft-tissue]
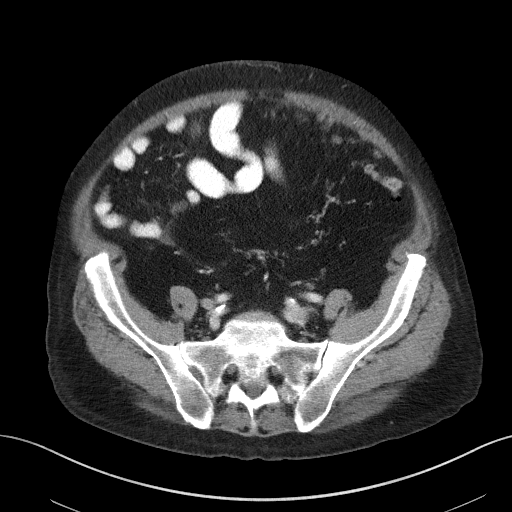
[im 50/99  soft-tissue]
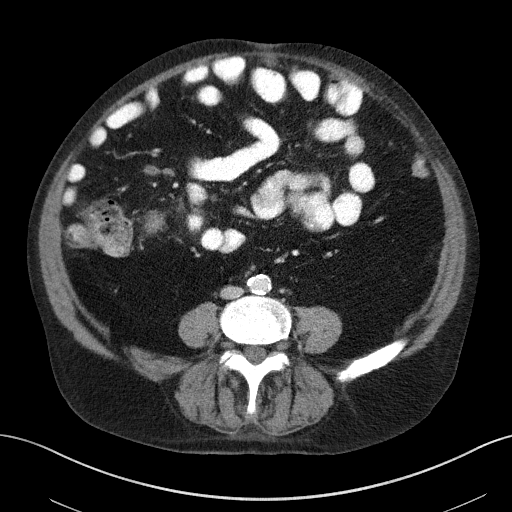
[im 60/99  soft-tissue]
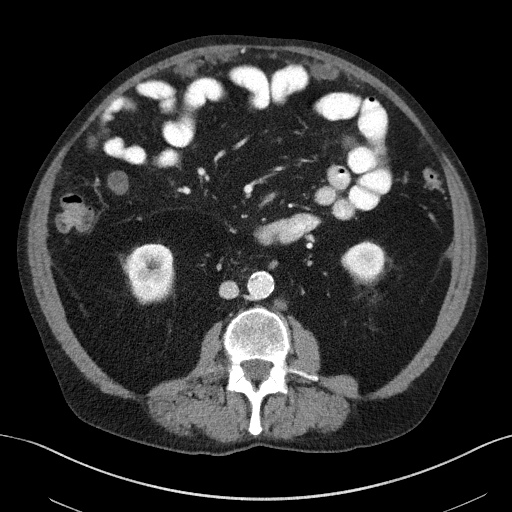
[im 66/99  soft-tissue]
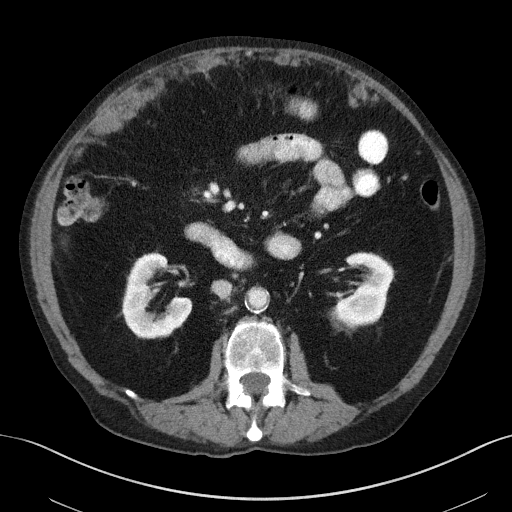
[im 77/99  soft-tissue]
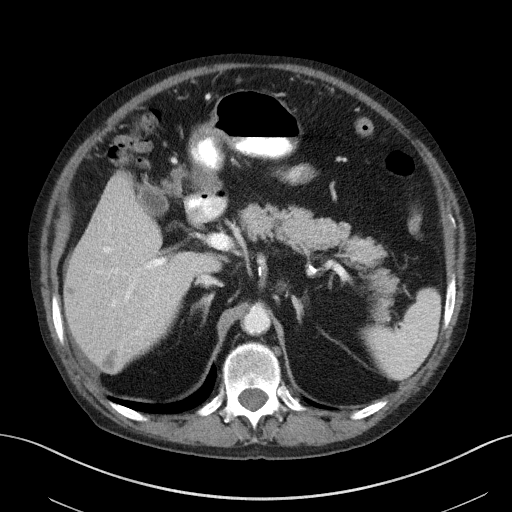
[im 77/99  lung]
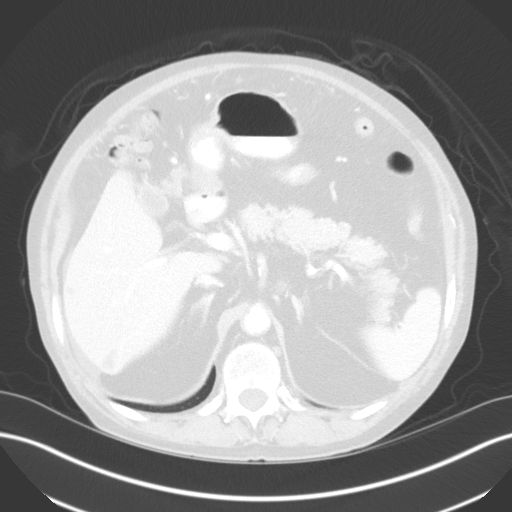
[im 77/99  bone]
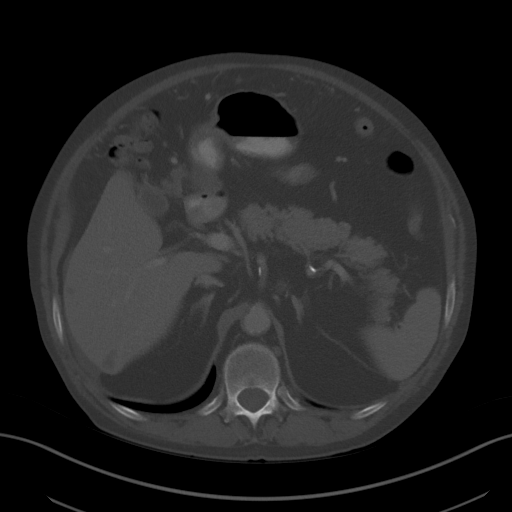
[im 82/99  soft-tissue]
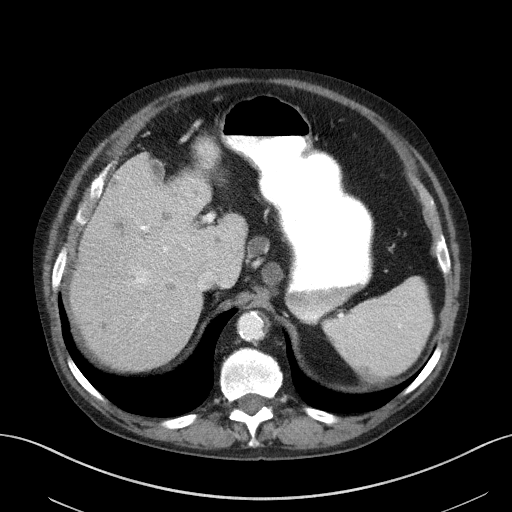
[im 82/99  lung]
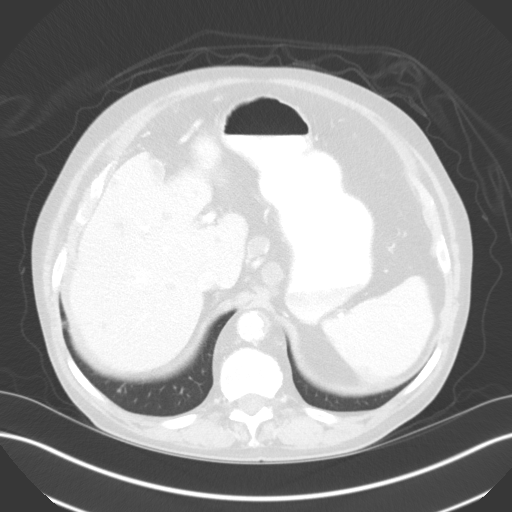
[im 88/99  lung]
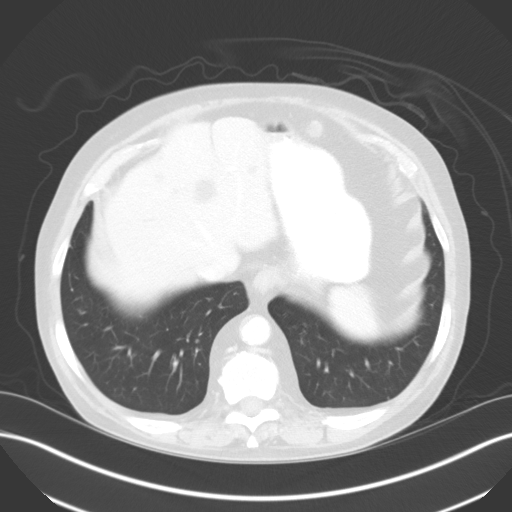
[im 93/99  soft-tissue]
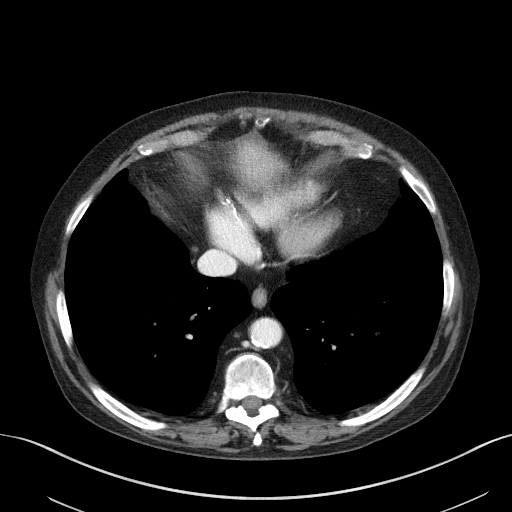
[im 93/99  lung]
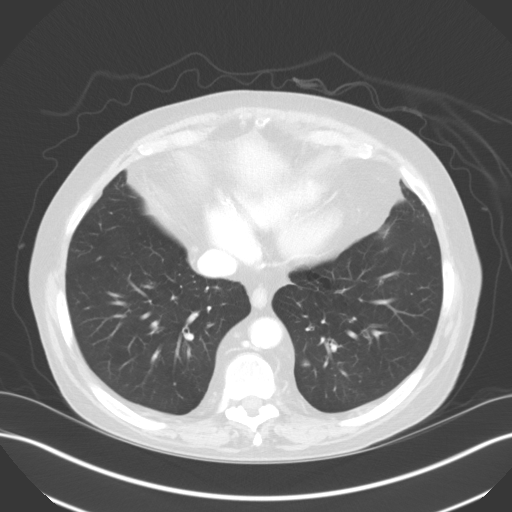

[12 of 32 positions shown; findings below may reference images not displayed]

FINDINGS: Lower chest: At the left lung base there is a pulmonary nodule which
measures 8 mm. Within the right lower lobe there is a pulmonary
nodule measuring 12 mm. A smaller nodule on the right lower lobe
measures 4 mm. There is extensive coronary artery disease. Heart is
normal size.

Upper abdomen: Numerous rounded hepatic lesions are identified,
involving all lobes. These range in size from 2 mm to 2 cm. The
largest is within the left hepatic lobe.

There is a 3 mm nodule within the posterior aspect of the spleen,
nonspecific in appearance. The adrenal glands are normal in
appearance. No contour deforming abnormality identified within the
pancreas. No hydronephrosis or renal mass. Gallbladder is present.

Gastrointestinal tract: There is no normal appendix. There is
abnormal appearance of the cecum. There is is an irregular mass
extending from the cecum, associated with mesenteric stranding and
surrounding mesenteric lymph nodes. Mass measures at least 5.1 x
x 4.8 cm and could be related to malignancy of the appendix or
cecum. On delayed images there is contrast distal to this mass,
indicating lack of obstruction. There are numerous colonic
diverticula. No acute diverticulitis.

Pelvis: The posterior wall of the urinary bladder appears irregular.
Although the appearance could be related to excretion of contrast
from the kidneys, bladder mass cannot be excluded. There are
prostatic calcifications. No free pelvic fluid.

Retroperitoneum: There is significant adenopathy. A 1.5 cm lymph
node is identified at the diaphragm. Numerous low-attenuation lymph
nodes are identified in the gastrohepatic ligament, largest
measuring 3.9 x 2.3 cm. Lymph node adjacent to the pancreatic head
is 1.1 x 1.7 cm. A lymph node in the right lower quadrant mesentery
is 2.0 cm. Mesentery has a nodular appearance particularly within
the pelvis. A left mesenteric nodule is 1.2 cm on image 63 of series
2.

There is mass like thickening of the omentum, particularly within
the upper abdomen consistent with omental cake and metastatic
disease.

Largest right inguinal lymph node is 5.3 x 3.2 cm. Left iliac chain
lymph nodes are identified, largest measuring 3.2 cm.

There is dense atherosclerotic calcification of the abdominal aorta.

Abdominal wall: No hernia.

Osseous structures: Remote superior endplate fracture of T10. No
suspicious lytic or blastic lesions are identified.
IMPRESSION: 1. Mass extending from the cecum suspicious for a cecal or
appendiceal carcinoma.
2. Metastatic disease within the omentum and mesentery.
3. Significant adenopathy in the abdomen and pelvis.
4. Numerous hepatic lesions are likely metastases.
5. Nodules within both lung bases, likely representing metastases.
6. Irregularity along the posterior wall of the bladder possibly
related to ureteral jets. However bladder mass needs to be
considered.
7. Significant coronary artery disease.
8. Atherosclerotic calcification of the abdominal aorta

## 2017-05-16 IMAGING — DX DG CLAVICLE*R*
2 series · 2 of 2 positions shown · non-contrast
Comparison: CT Abdomen and Pelvis 10/07/2015

CLINICAL DATA: 80-year-old male with Right clavicle pain and
tenderness. Cecal mass with metastatic disease in the abdomen on CT
Abdomen and Pelvis in [REDACTED]. Initial encounter.

EXAM:
RIGHT CLAVICLE - 2+ VIEWS

[clavicle ap]
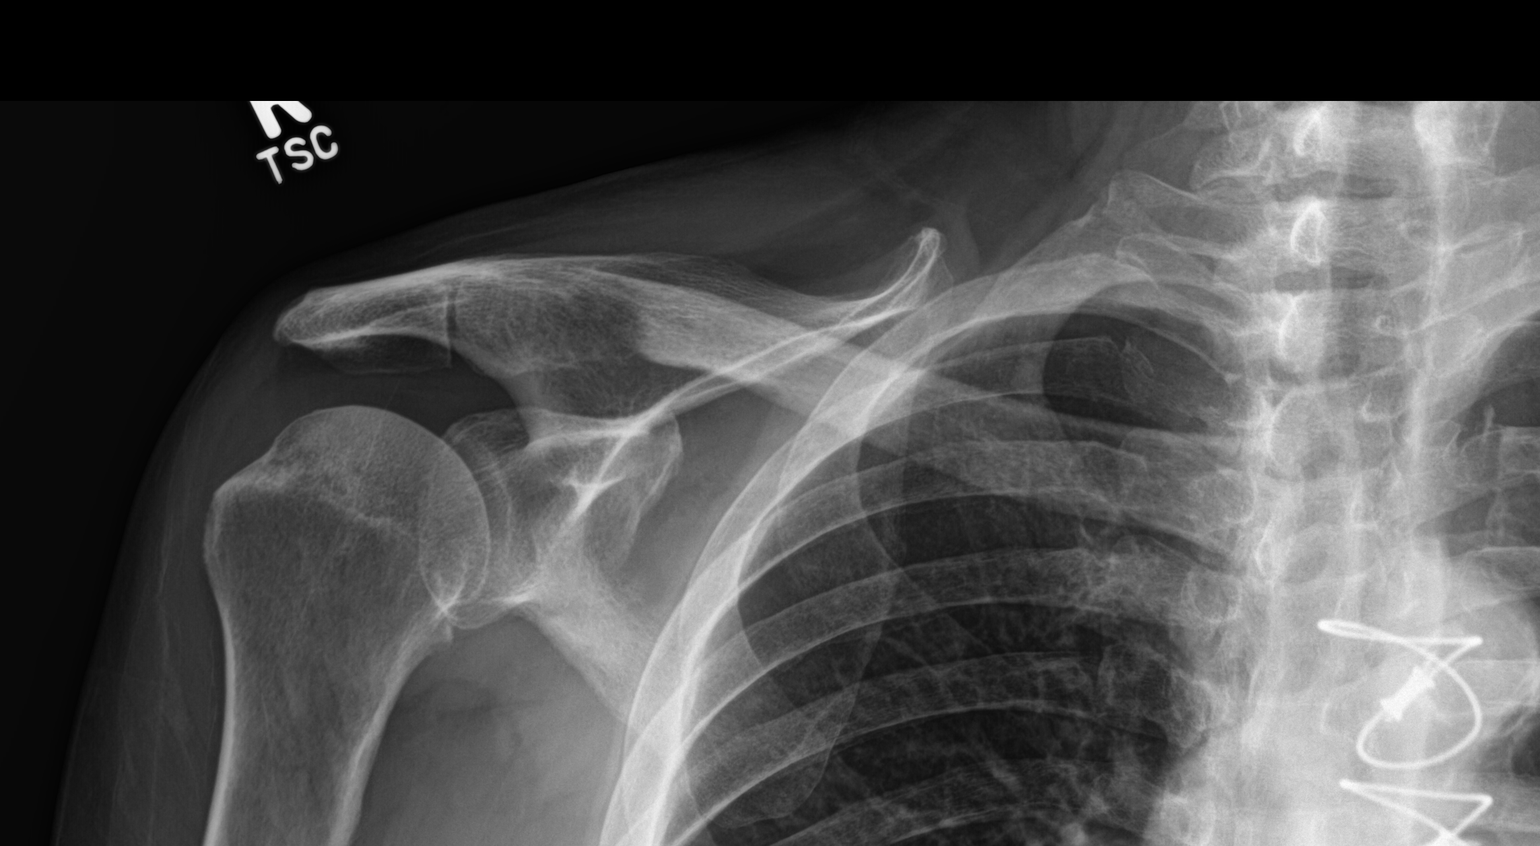

[clavicle axial]
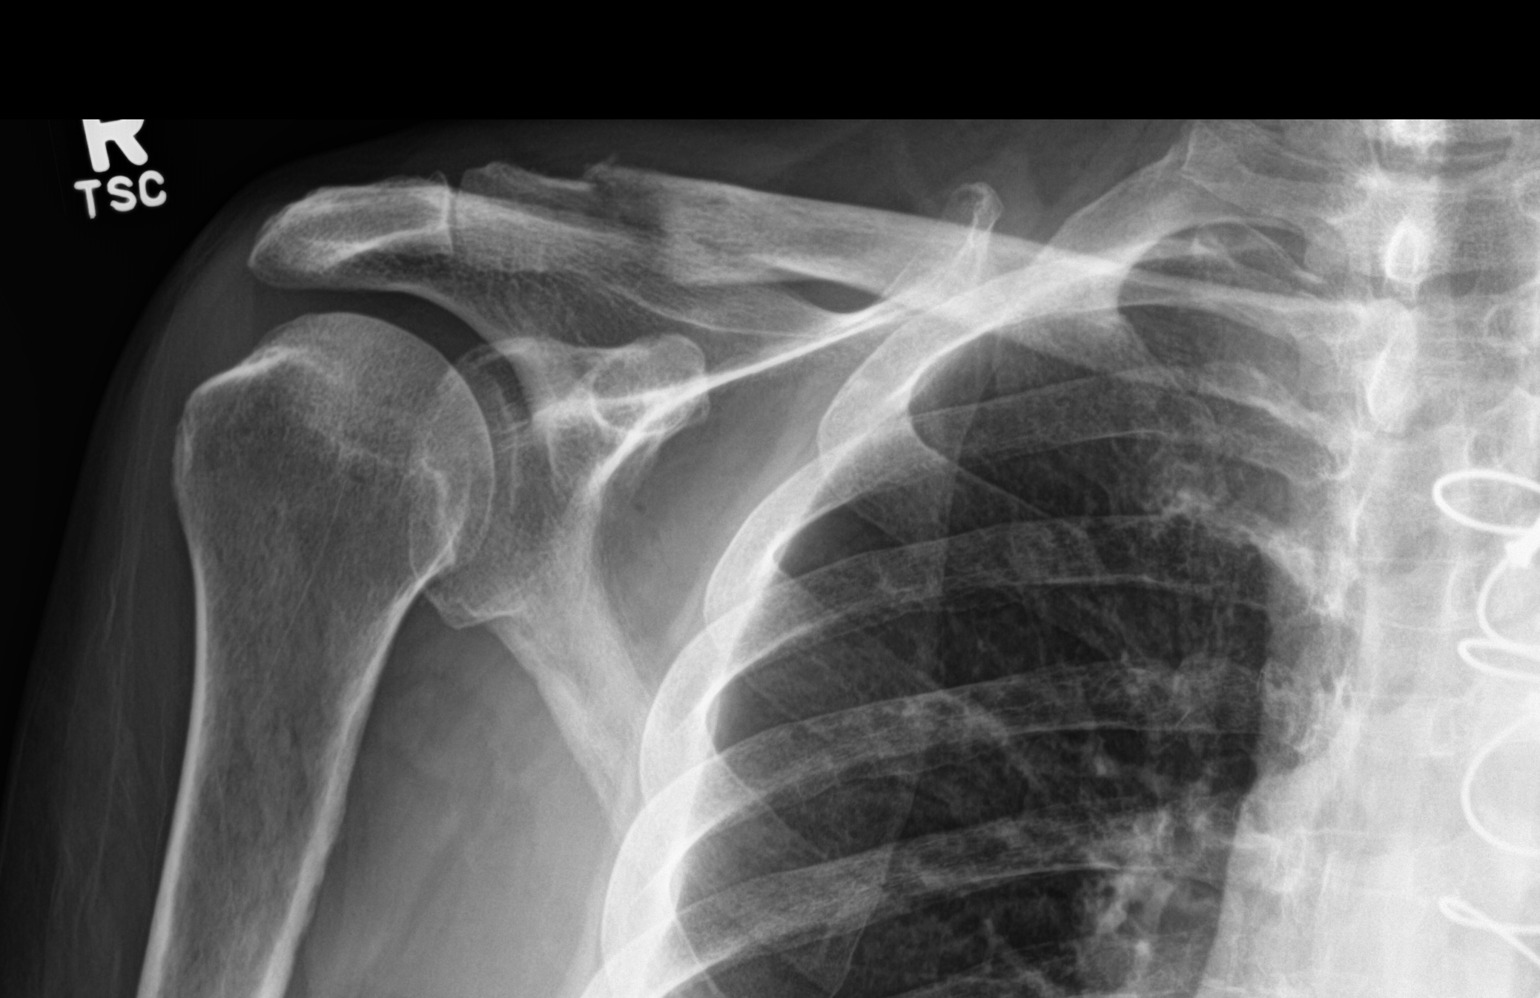

[2 of 2 positions shown; findings below may reference images not displayed]

FINDINGS: Pathologic fracture through the distal right clavicle shaft
occurring at the site of a 2 cm lytic bone lesion. The fracture is
minimally displaced. The right acromioclavicular joint appears to
remain intact.

Negative visualized right upper ribs and lung parenchyma. No other
acute or suspicious osseous lesion identified.
IMPRESSION: Minimally displaced Pathologic fracture through the distal right
clavicle occurring at the site of a 2 cm lytic bone lesion.
